# Patient Record
Sex: Female | Born: 1990 | Marital: Married
Health system: Southern US, Community
[De-identification: ages and names within clinical notes are randomized; demographics above are authoritative.]

## PROBLEM LIST (undated history)

## (undated) HISTORY — PX: WRIST ARTHROPLASTY: SHX1088

---

## 2018-07-31 ENCOUNTER — Ambulatory Visit: Payer: Self-pay | Admitting: Obstetrics & Gynecology

## 2018-07-31 DIAGNOSIS — Z01419 Encounter for gynecological examination (general) (routine) without abnormal findings: Secondary | ICD-10-CM

## 2020-05-17 DIAGNOSIS — Z03818 Encounter for observation for suspected exposure to other biological agents ruled out: Secondary | ICD-10-CM | POA: Diagnosis not present

## 2020-05-17 DIAGNOSIS — Z20822 Contact with and (suspected) exposure to covid-19: Secondary | ICD-10-CM | POA: Diagnosis not present

## 2020-05-21 DIAGNOSIS — Z03818 Encounter for observation for suspected exposure to other biological agents ruled out: Secondary | ICD-10-CM | POA: Diagnosis not present

## 2020-05-21 DIAGNOSIS — J22 Unspecified acute lower respiratory infection: Secondary | ICD-10-CM | POA: Diagnosis not present

## 2020-05-31 ENCOUNTER — Ambulatory Visit: Payer: Self-pay | Admitting: Family Medicine

## 2020-08-04 DIAGNOSIS — Z20822 Contact with and (suspected) exposure to covid-19: Secondary | ICD-10-CM | POA: Diagnosis not present

## 2020-09-13 DIAGNOSIS — M25562 Pain in left knee: Secondary | ICD-10-CM | POA: Diagnosis not present

## 2020-09-15 DIAGNOSIS — M25562 Pain in left knee: Secondary | ICD-10-CM | POA: Diagnosis not present

## 2020-09-20 ENCOUNTER — Encounter: Payer: Self-pay | Admitting: Family Medicine

## 2020-09-20 ENCOUNTER — Ambulatory Visit (INDEPENDENT_AMBULATORY_CARE_PROVIDER_SITE_OTHER): Payer: BC Managed Care – PPO | Admitting: Family Medicine

## 2020-09-20 ENCOUNTER — Other Ambulatory Visit: Payer: Self-pay

## 2020-09-20 ENCOUNTER — Ambulatory Visit: Payer: Self-pay

## 2020-09-20 DIAGNOSIS — M25562 Pain in left knee: Secondary | ICD-10-CM

## 2020-09-20 DIAGNOSIS — M2392 Unspecified internal derangement of left knee: Secondary | ICD-10-CM | POA: Diagnosis not present

## 2020-09-20 NOTE — Progress Notes (Signed)
   Office Visit Note   Patient: Laura Moody           Date of Birth: 07/24/1991           MRN: [id number] Visit Date: 09/20/2020 Requested by: No referring provider defined for this encounter. PCP: No primary care provider on file.  Subjective: Chief Complaint  Patient presents with  . Left Knee - Pain    Pain started 3 weeks ago, with a dull,achy pain. NKI. Knee started locking up with bending the knee. Clicking in the knee. Has had dry needling - could barely walk afterward - started walking with 1 crutch. Pain inferior to patella & medial.    HPI: She is here at the request of Dr. Einar Pheasant for left knee pain.  Symptoms started about 2 to 3 weeks ago, no definite injury.  She began feeling a dull achy pain in the patellofemoral area and then her knee started locking in flexion.  She began seeing Dr. Einar Pheasant last week for physical therapy and has had some dry needling sessions.  She has gotten a little bit worse, now she is walking with a crutch for support.  She does not take medicine for pain.  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               ROS:   All other systems were reviewed and are negative.  Objective: Vital Signs: There were no vitals taken for this visit.  Physical Exam:  General:  Alert and oriented, in no acute distress. Pulm:  Breathing unlabored. Psy:  Normal mood, congruent affect. Skin:  No erythema  Left knee: She has a patellofemoral click at about 60 degrees of flexion.  This is painful for her.  No effusion today, negative patella apprehension test.  Lachman's is solid, PCL is solid, no laxity with varus or valgus stress.  She is tender on the medial joint line and has a palpable click with pain on McMurray's.  No lateral tenderness, no tenderness over the quadriceps or patellar tendons today.    Imaging: XR KNEE 3 VIEW LEFT  Result Date: 09/20/2020 Three-view x-rays of the left knee reveal normal bony anatomy, no sign of DJD, loose body,  OCD, stress fracture.   Assessment & Plan: 1.  Left knee pain and locking, possibilities include medial meniscus tear, patellofemoral articular cartilage defect. -Discussed options with her and elected to try a patella strap brace and continued physical therapy.  If symptoms persist she will call me and I will order MRI scan to further evaluate.     Procedures: No procedures performed  No notes on file     PMFS History: There are no problems to display for this patient.  History reviewed. No pertinent past medical history.  History reviewed. No pertinent family history.  History reviewed. No pertinent surgical history. Social History   Occupational History  . Not on file  Tobacco Use  . Smoking status: Not on file  Substance and Sexual Activity  . Alcohol use: Not on file  . Drug use: Not on file  . Sexual activity: Not on file

## 2020-09-20 NOTE — Patient Instructions (Signed)
  Cho-Pat knee strap  [email or web address]

## 2020-09-21 DIAGNOSIS — M25562 Pain in left knee: Secondary | ICD-10-CM | POA: Diagnosis not present

## 2020-09-29 DIAGNOSIS — M25562 Pain in left knee: Secondary | ICD-10-CM | POA: Diagnosis not present

## 2020-09-30 ENCOUNTER — Telehealth: Payer: Self-pay | Admitting: Family Medicine

## 2020-09-30 DIAGNOSIS — M25562 Pain in left knee: Secondary | ICD-10-CM

## 2020-09-30 DIAGNOSIS — M2392 Unspecified internal derangement of left knee: Secondary | ICD-10-CM

## 2020-09-30 NOTE — Telephone Encounter (Signed)
Patient called advised her knee is not getting any better and would like for Dr. Prince Rome to order an MRI for her. The number to contact patient is [phone]

## 2020-10-01 NOTE — Telephone Encounter (Signed)
I called and left this info on the patient's voice mail. 

## 2020-10-01 NOTE — Telephone Encounter (Signed)
Ordered

## 2020-10-01 NOTE — Telephone Encounter (Signed)
Please advise 

## 2020-10-04 DIAGNOSIS — M25562 Pain in left knee: Secondary | ICD-10-CM | POA: Diagnosis not present

## 2020-10-07 DIAGNOSIS — M25562 Pain in left knee: Secondary | ICD-10-CM | POA: Diagnosis not present

## 2020-10-13 DIAGNOSIS — M25562 Pain in left knee: Secondary | ICD-10-CM | POA: Diagnosis not present

## 2020-10-19 ENCOUNTER — Telehealth: Payer: Self-pay | Admitting: Family Medicine

## 2020-10-19 ENCOUNTER — Encounter: Payer: Self-pay | Admitting: Family Medicine

## 2020-10-19 DIAGNOSIS — M25562 Pain in left knee: Secondary | ICD-10-CM

## 2020-10-19 DIAGNOSIS — M2392 Unspecified internal derangement of left knee: Secondary | ICD-10-CM

## 2020-10-19 MED ORDER — AZITHROMYCIN 250 MG PO TABS: ORAL_TABLET | ORAL | 0 refills | Status: AC

## 2020-10-19 NOTE — Addendum Note (Signed)
Addended by: Lillia Carmel on: 10/19/2020 11:04 AM   Modules accepted: Orders

## 2020-10-19 NOTE — Telephone Encounter (Signed)
Dr. Prince Rome is dealing with this through MyChart -- she needs covid testing first.

## 2020-10-19 NOTE — Telephone Encounter (Signed)
Called pt and left vm to call and set appt with Dr. Prince Rome for upper respiratory problems. Will try again later.

## 2020-10-21 ENCOUNTER — Other Ambulatory Visit: Payer: BC Managed Care – PPO

## 2020-11-02 NOTE — Addendum Note (Signed)
Addended by: Lillia Carmel on: 11/02/2020 11:15 AM   Modules accepted: Orders

## 2020-11-09 DIAGNOSIS — M25562 Pain in left knee: Secondary | ICD-10-CM | POA: Diagnosis not present

## 2020-11-10 ENCOUNTER — Other Ambulatory Visit: Payer: Self-pay | Admitting: Family Medicine

## 2020-11-10 ENCOUNTER — Other Ambulatory Visit: Payer: Self-pay

## 2020-11-10 ENCOUNTER — Telehealth: Payer: Self-pay | Admitting: Family Medicine

## 2020-11-10 ENCOUNTER — Ambulatory Visit
Admission: RE | Admit: 2020-11-10 | Discharge: 2020-11-10 | Disposition: A | Discharge status: Home / Self Care | Payer: BC Managed Care – PPO | Source: Ambulatory Visit | Attending: Family Medicine | Admitting: Family Medicine

## 2020-11-10 DIAGNOSIS — M25562 Pain in left knee: Secondary | ICD-10-CM

## 2020-11-10 DIAGNOSIS — M2392 Unspecified internal derangement of left knee: Secondary | ICD-10-CM

## 2020-11-10 DIAGNOSIS — G8929 Other chronic pain: Secondary | ICD-10-CM

## 2020-11-10 NOTE — Telephone Encounter (Signed)
MRI looks good.

## 2020-11-11 NOTE — Progress Notes (Signed)
New message sent to patient through MyChart to schedule this blood work appointment.

## 2020-11-23 DIAGNOSIS — M25562 Pain in left knee: Secondary | ICD-10-CM | POA: Diagnosis not present

## 2020-12-01 DIAGNOSIS — M25562 Pain in left knee: Secondary | ICD-10-CM | POA: Diagnosis not present

## 2020-12-08 DIAGNOSIS — M25562 Pain in left knee: Secondary | ICD-10-CM | POA: Diagnosis not present

## 2020-12-23 DIAGNOSIS — Z20822 Contact with and (suspected) exposure to covid-19: Secondary | ICD-10-CM | POA: Diagnosis not present

## 2020-12-23 DIAGNOSIS — U071 COVID-19: Secondary | ICD-10-CM | POA: Diagnosis not present

## 2020-12-24 ENCOUNTER — Encounter: Payer: Self-pay | Admitting: Family Medicine

## 2020-12-27 MED ORDER — DIPHENOXYLATE-ATROPINE 2.5-0.025 MG PO TABS: 1.0000 | ORAL_TABLET | Freq: Four times a day (QID) | ORAL | 0 refills | Status: AC | PRN

## 2020-12-27 MED ORDER — PROMETHAZINE HCL 25 MG PO TABS: 12.5000 mg | ORAL_TABLET | Freq: Four times a day (QID) | ORAL | 1 refills | Status: AC | PRN

## 2020-12-27 NOTE — Addendum Note (Signed)
Addended by: Lillia Carmel on: 12/27/2020 07:52 AM   Modules accepted: Orders

## 2021-07-08 ENCOUNTER — Emergency Department (HOSPITAL_COMMUNITY)
Admission: EM | Admit: 2021-07-08 | Discharge: 2021-07-08 | Disposition: A | Discharge status: Home / Self Care | Payer: No Typology Code available for payment source | Attending: Emergency Medicine | Admitting: Emergency Medicine

## 2021-07-08 ENCOUNTER — Other Ambulatory Visit: Payer: Self-pay

## 2021-07-08 ENCOUNTER — Encounter (HOSPITAL_COMMUNITY): Payer: Self-pay

## 2021-07-08 DIAGNOSIS — S51812A Laceration without foreign body of left forearm, initial encounter: Secondary | ICD-10-CM | POA: Insufficient documentation

## 2021-07-08 DIAGNOSIS — W260XXA Contact with knife, initial encounter: Secondary | ICD-10-CM | POA: Diagnosis not present

## 2021-07-08 MED ORDER — LIDOCAINE-EPINEPHRINE 2 %-1:100000 IJ SOLN: 20.0000 mL | Freq: Once | INTRAMUSCULAR | Status: AC

## 2021-07-08 MED ORDER — LIDOCAINE-EPINEPHRINE 2 %-1:100000 IJ SOLN
INTRAMUSCULAR | Status: AC
  Administered 2021-07-08: 20 mL
  Filled 2021-07-08: qty 1

## 2021-07-08 MED ORDER — LIDOCAINE-EPINEPHRINE (PF) 2 %-1:200000 IJ SOLN: 10.0000 mL | Freq: Once | INTRAMUSCULAR | Status: DC

## 2021-07-08 NOTE — Discharge Instructions (Addendum)
You will need to have the sutures removed in 7-10 days time. You can return to the ED for removal.   Return sooner for signs of infection including redness/swelling around the wound, drainage of pus, fevers > 100.4.   Take Ibuprofen and Tylenol as needed for pain.

## 2021-07-08 NOTE — ED Notes (Signed)
Non-adherent dressing applied to pt's left arm and wrapped with coban.

## 2021-07-08 NOTE — ED Triage Notes (Signed)
Pt came in with c/o L forearm laceration after trying to craft and cut herself with a knife. Bleeding controlled. Tetanus shot given 2016

## 2021-07-08 NOTE — ED Provider Notes (Signed)
[facility]-EMERGENCY DEPT Provider Note   CSN: [id number] Arrival date & time: 07/08/21  1958     History Chief Complaint  Patient presents with   Extremity Laceration    Laura Moody is a 30 y.o. female who presents to the ED for laceration to left arm that occurred just PTA.  Patient reports she was crafting with a knife while trying to cut cardboard when she accidentally sliced her forearm.  She reports tetanus is up-to-date in 2016.  Bleeding controlled at this time.  She complains of some mild pain to the area.  No other complaints at this time.   The history is provided by the patient and medical records.      No past medical history on file.  There are no problems to display for this patient.   Past Surgical History:  Procedure Laterality Date   CESAREAN SECTION     WRIST ARTHROPLASTY       OB History   No obstetric history on file.     Family History  Problem Relation Age of Onset   Heart failure Father    Diabetes Father     Social History   Substance Use Topics   Drug use: Never    Home Medications Prior to Admission medications   Medication Sig Start Date End Date Taking? Authorizing Provider  azithromycin (ZITHROMAX) 250 MG tablet 2 PO x 1 day, then 1 PO qd x 4 more days. 10/19/20   Hilts, Casimiro Needle, MD  diphenoxylate-atropine (LOMOTIL) 2.5-0.025 MG tablet Take 1-2 tablets by mouth 4 (four) times daily as needed for diarrhea or loose stools. 12/27/20   Hilts, Casimiro Needle, MD  promethazine (PHENERGAN) 25 MG tablet Take 0.5-1 tablets (12.5-25 mg total) by mouth every 6 (six) hours as needed for nausea or vomiting. 12/27/20   Hilts, Casimiro Needle, MD    Allergies    Patient has no known allergies.  Review of Systems   Review of Systems  Constitutional:  Negative for chills and fever.  Musculoskeletal:  Positive for arthralgias.  Skin:  Positive for wound.  All other systems reviewed and are negative.  Physical Exam Updated Vital  Signs BP 103/62   Pulse 63   Temp 98.3 F (36.8 C) (Oral)   Resp 16   Ht 5\' 2"  (1.575 m)   Wt 51.7 kg   SpO2 99%   BMI 20.85 kg/m   Physical Exam Vitals and nursing note reviewed.  Constitutional:      Appearance: She is not ill-appearing.  HENT:     Head: Normocephalic and atraumatic.  Eyes:     Conjunctiva/sclera: Conjunctivae normal.  Cardiovascular:     Rate and Rhythm: Normal rate and regular rhythm.  Pulmonary:     Effort: Pulmonary effort is normal.     Breath sounds: Normal breath sounds.  Musculoskeletal:     Comments: 2 cm laceration to ventral aspect of left forearm; bleeding controlled. ROM intact to elbow, wrist, and all digits. 2+ radial pulse.   Skin:    General: Skin is warm and dry.     Coloration: Skin is not jaundiced.  Neurological:     Mental Status: She is alert.    ED Results / Procedures / Treatments   Labs (all labs ordered are listed, but only abnormal results are displayed) Labs Reviewed - No data to display  EKG None  Radiology No results found.  Procedures . Laceration Repair  Date/Time: 07/08/2021 10:05 PM Performed by: 09/07/2021, PA-C Authorized by:  Tanda Rockers, PA-C   Consent:    Consent obtained:  Verbal   Consent given by:  Patient   Risks discussed:  Infection, pain and poor cosmetic result Universal protocol:    Patient identity confirmed:  Verbally with patient Laceration details:    Location:  Shoulder/arm   Shoulder/arm location:  L lower arm   Length (cm):  2   Depth (mm):  2 Pre-procedure details:    Preparation:  Patient was prepped and draped in usual sterile fashion Treatment:    Area cleansed with:  Povidone-iodine   Irrigation solution:  Sterile saline Skin repair:    Repair method:  Sutures   Suture size:  4-0   Suture material:  Prolene   Suture technique:  Simple interrupted   Number of sutures:  3 Approximation:    Approximation:  Close Repair type:    Repair type:   Intermediate Post-procedure details:    Dressing:  Non-adherent dressing   Procedure completion:  Tolerated well, no immediate complications   Medications Ordered in ED Medications  lidocaine-EPINEPHrine (XYLOCAINE W/EPI) 2 %-1:100000 (with pres) injection 20 mL (20 mLs Infiltration Given by Other 07/08/21 2159)    ED Course  I have reviewed the triage vital signs and the nursing notes.  Pertinent labs & imaging results that were available during my care of the patient were reviewed by me and considered in my medical decision making (see chart for details).    MDM Rules/Calculators/A&P                           30 year old female presents to the ED today after sustaining a laceration to her left forearm just prior to arrival.  On arrival bleeding is controlled.  Vitals are stable.  Patient will require sutures at this time.  Her tetanus is up-to-date.  Sutures applied without difficulty.  Dressing applied.  Patient instructed to return to the ED in 7 to 10 days for suture removal.  She is stable for discharge.  This note was prepared using Dragon voice recognition software and may include unintentional dictation errors due to the inherent limitations of voice recognition software.   Final Clinical Impression(s) / ED Diagnoses Final diagnoses:  Laceration of left forearm, initial encounter    Rx / DC Orders ED Discharge Orders     None        Discharge Instructions      You will need to have the sutures removed in 7-10 days time. You can return to the ED for removal.   Return sooner for signs of infection including redness/swelling around the wound, drainage of pus, fevers > 100.4.   Take Ibuprofen and Tylenol as needed for pain.        Tanda Rockers, PA-C 07/08/21 2208    Lorre Nick, MD 07/12/21 1319

## 2022-09-15 IMAGING — MR MR KNEE*L* W/O CM
4 of 6 series · 23 of 40 positions shown · non-contrast
Comparison: None.

CLINICAL DATA: Left knee pain.

EXAM:
MRI OF THE LEFT KNEE WITHOUT CONTRAST
TECHNIQUE: Multiplanar, multisequence MR imaging of the knee was performed. No
intravenous contrast was administered.

[Series 3: T2 fat-sat · axial · 4.0mm · 0.62mm/px · z∈[-111,+4]mm · 5 of 24 slices shown (1 of 2)]
[im 1/24]
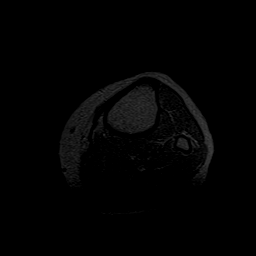
[im 5/24]
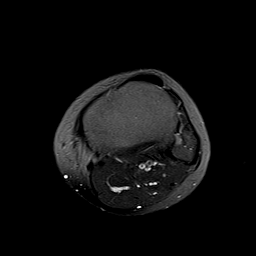
[im 10/24]
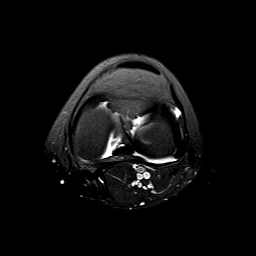
[im 14/24]
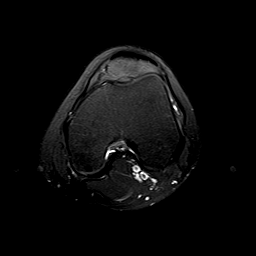
[im 24/24]
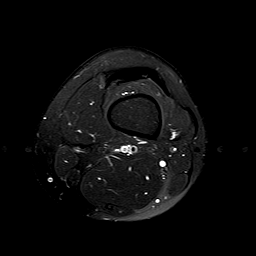

[Series 5: T2 fat-sat · coronal · 4.0mm · 0.29mm/px · 3 of 22 slices shown (2 of 2)]
[im 5/22]
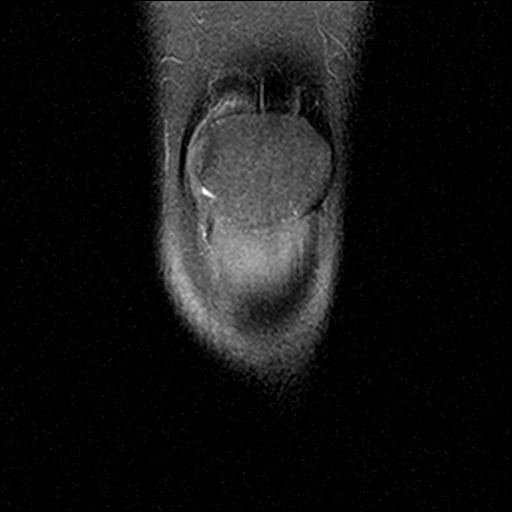
[im 13/22]
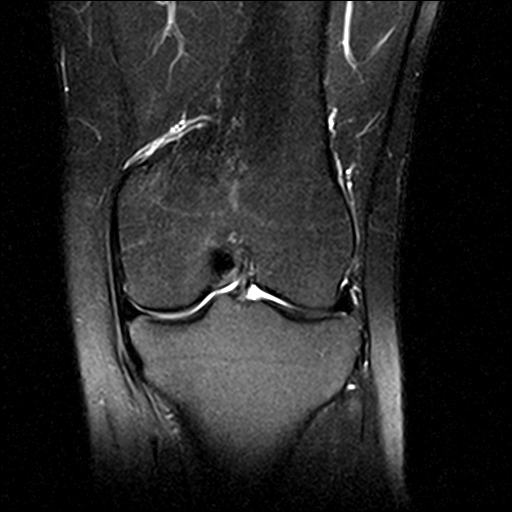
[im 22/22]
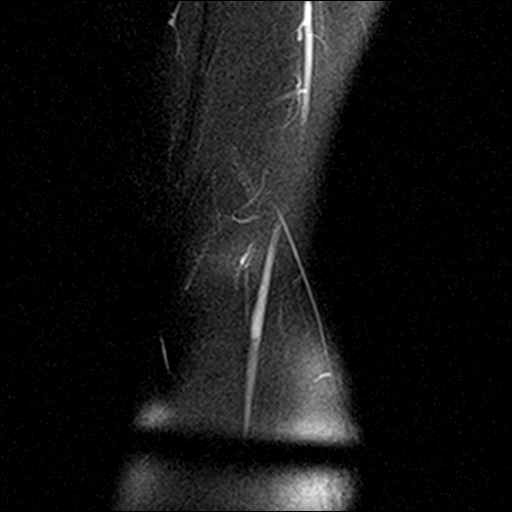

[Series 7: PD fat-sat · sagittal · 3.0mm · 0.29mm/px · 7 of 27 slices shown (1 of 2)]
[im 1/27]
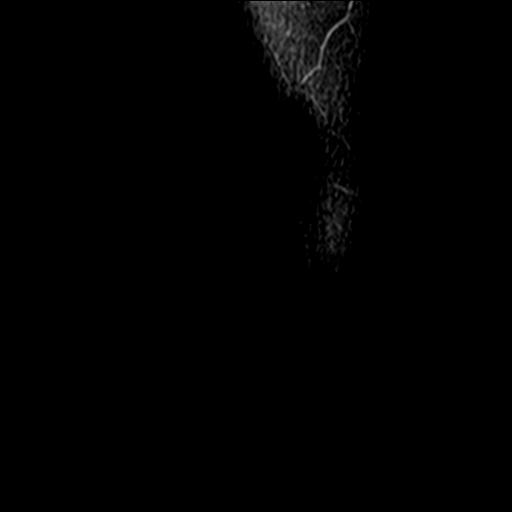
[im 5/27]
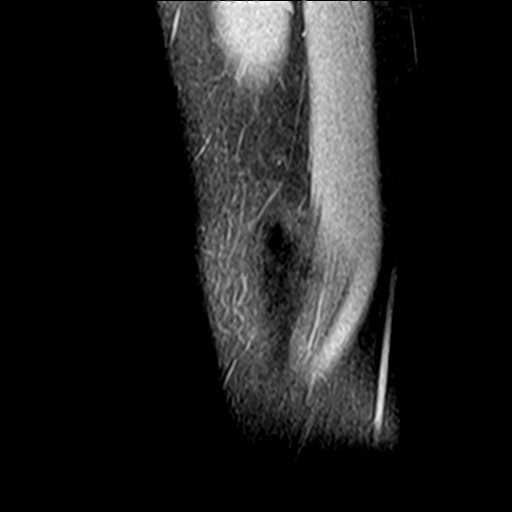
[im 9/27]
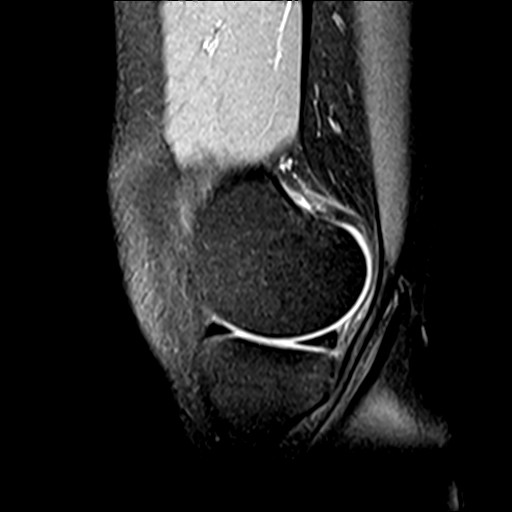
[im 14/27]
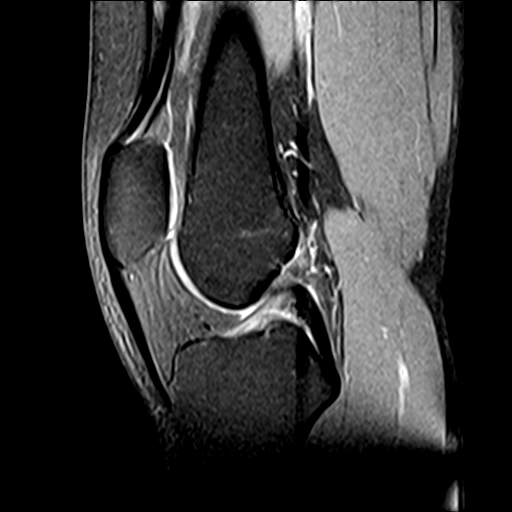
[im 18/27]
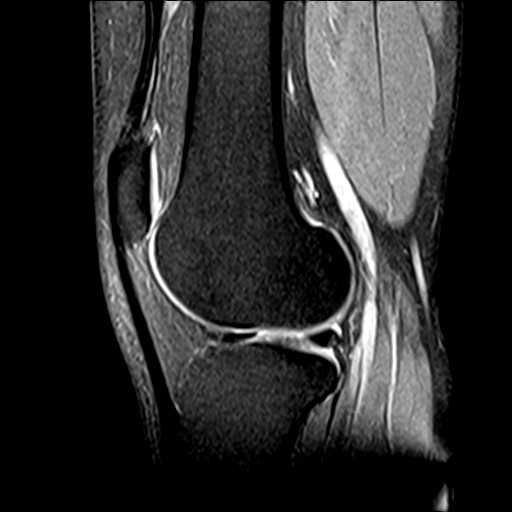
[im 22/27]
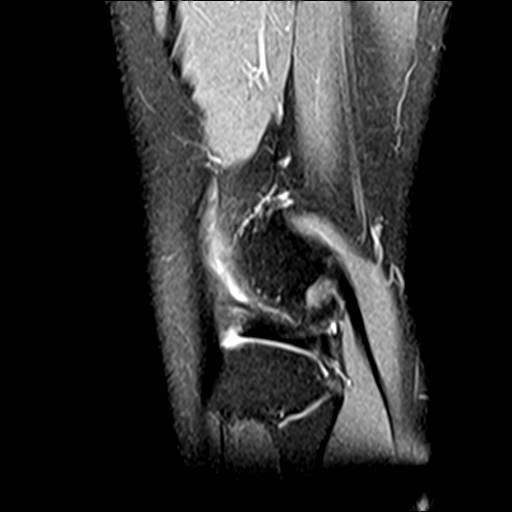
[im 27/27]
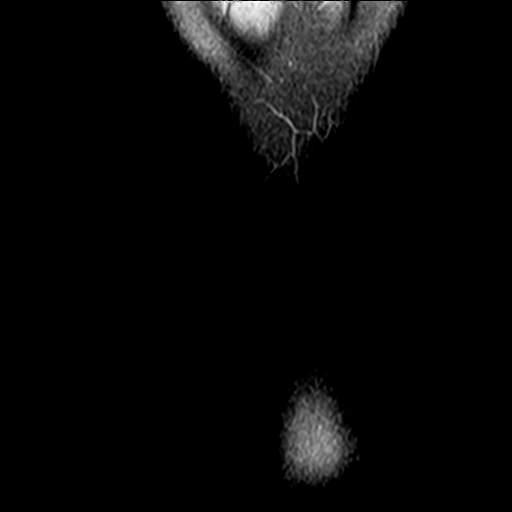

[Series 8: PD fat-sat · coronal · 3.0mm · 0.29mm/px · 8 of 28 slices shown (2 of 2)]
[im 1/28]
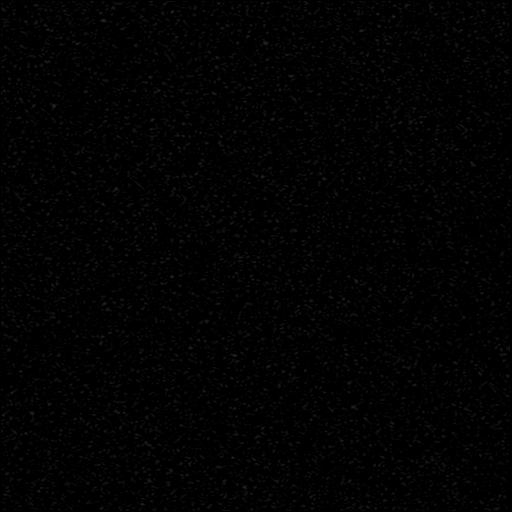
[im 4/28]
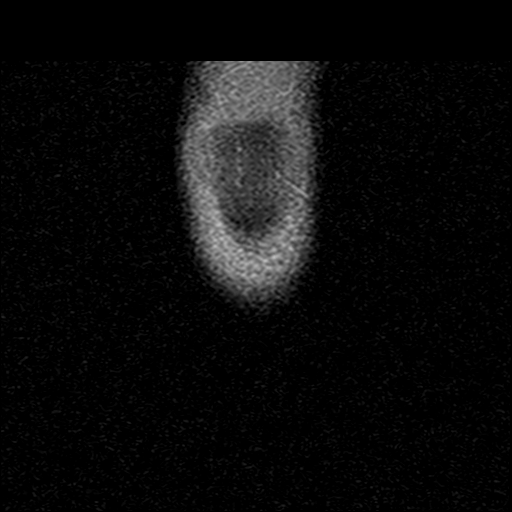
[im 8/28]
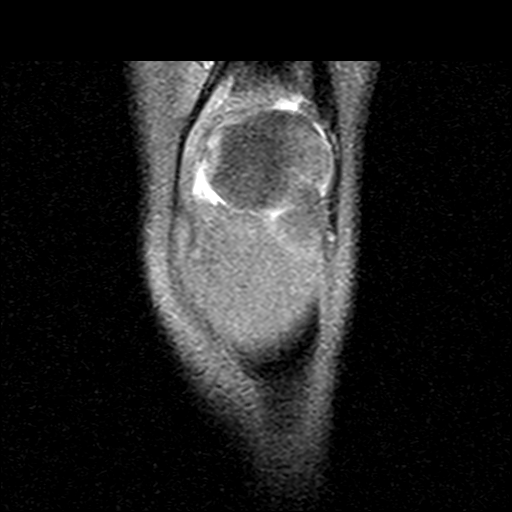
[im 12/28]
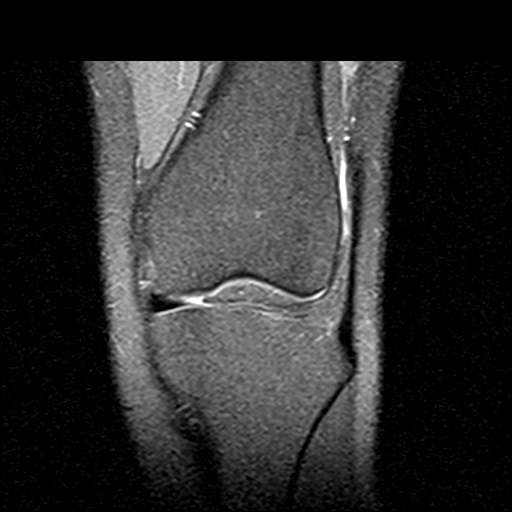
[im 16/28]
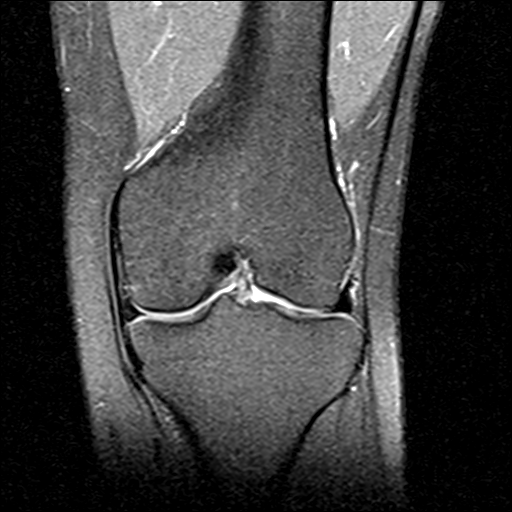
[im 20/28]
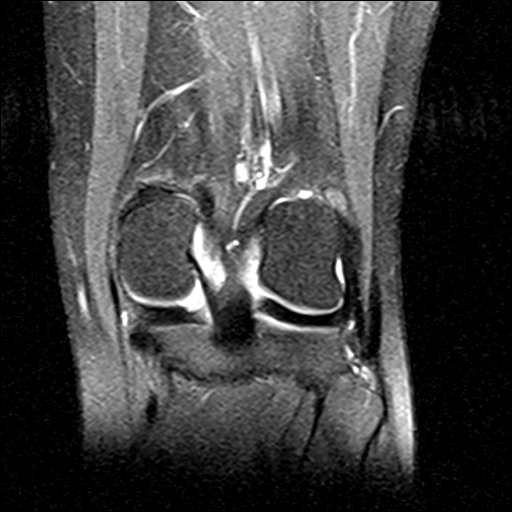
[im 24/28]
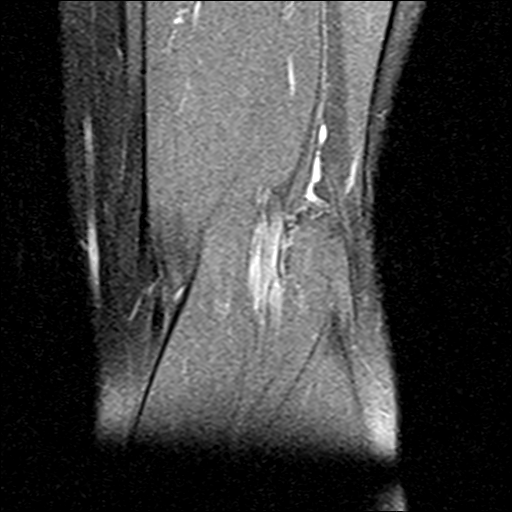
[im 28/28]
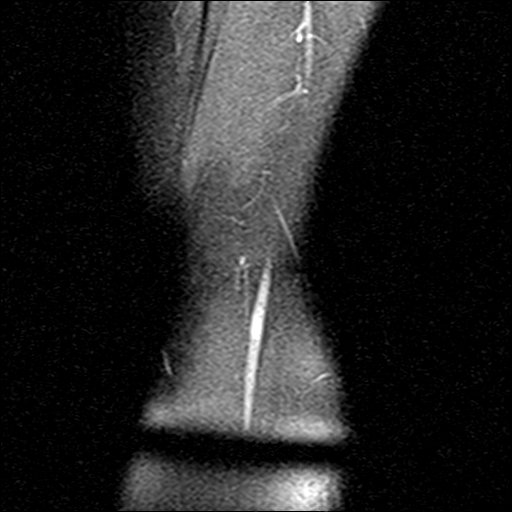

[23 of 40 positions shown; findings below may reference images not displayed]

FINDINGS: MENISCI

Medial: Intact.

Lateral: Intact.

LIGAMENTS

Cruciates: ACL and PCL are intact.

Collaterals: Medial collateral ligament is intact. Lateral
collateral ligament complex is intact.

CARTILAGE

Patellofemoral:  No chondral defect.

Medial:  No chondral defect.

Lateral:  No chondral defect.

JOINT: No joint effusion. Normal Ghilani Alfrido. No plical
thickening.

POPLITEAL FOSSA: Popliteus tendon is intact. No Baker's cyst.

EXTENSOR MECHANISM: Intact quadriceps tendon. Intact patellar
tendon. Intact lateral patellar retinaculum. Intact medial patellar
retinaculum. Intact MPFL.

BONES: No aggressive osseous lesion. No fracture or dislocation.

Other: No fluid collection or hematoma. Muscles are normal.
IMPRESSION: 1. No internal derangement of the left knee.
